# Patient Record
Sex: Female | Born: 1935 | Race: White | Hispanic: No | Marital: Single | Smoking: Never smoker
Health system: Southern US, Community
[De-identification: ages and names within clinical notes are randomized; demographics above are authoritative.]

## PROBLEM LIST (undated history)

## (undated) DIAGNOSIS — G20 Parkinson's disease: Secondary | ICD-10-CM

## (undated) DIAGNOSIS — I10 Essential (primary) hypertension: Secondary | ICD-10-CM

## (undated) DIAGNOSIS — M810 Age-related osteoporosis without current pathological fracture: Secondary | ICD-10-CM

---

## 2006-02-11 ENCOUNTER — Ambulatory Visit: Payer: Self-pay | Admitting: Gastroenterology

## 2006-02-20 ENCOUNTER — Ambulatory Visit: Payer: Self-pay | Admitting: Gastroenterology

## 2006-02-22 ENCOUNTER — Ambulatory Visit: Payer: Self-pay | Admitting: Gastroenterology

## 2006-04-25 ENCOUNTER — Ambulatory Visit: Payer: Self-pay | Admitting: Specialist

## 2006-08-20 ENCOUNTER — Ambulatory Visit: Payer: Self-pay | Admitting: Internal Medicine

## 2006-09-01 ENCOUNTER — Other Ambulatory Visit: Payer: Self-pay

## 2006-09-01 ENCOUNTER — Emergency Department: Payer: Self-pay | Admitting: Emergency Medicine

## 2008-06-26 IMAGING — US US EXTREM LOW VENOUS BILAT
1 series · 17 of 24 positions shown · non-contrast
Comparison: none

REASON FOR EXAM: Bila Edema Eval for DVT Call Report [id number] Ext [phone]
COMMENTS:

PROCEDURE:     US  - US DOPPLER LOW EXTR BILATERAL  - August 20, 2006 [DATE]
RESULT:     The venous flow waveforms bilaterally are normal in appearance.
The femoral and popliteal veins bilaterally show normal compressibility.
Doppler examination shows no deep venous occlusion on either side.

[Series 1: us extrem low venous bilat · 17 of 40 slices shown]
[im 1/40]
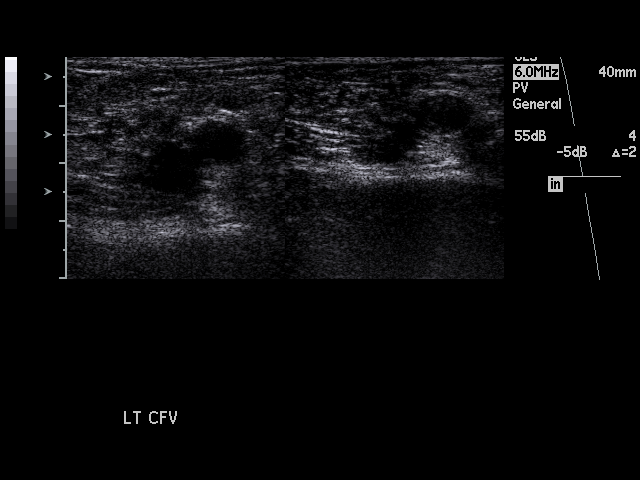
[im 4/40]
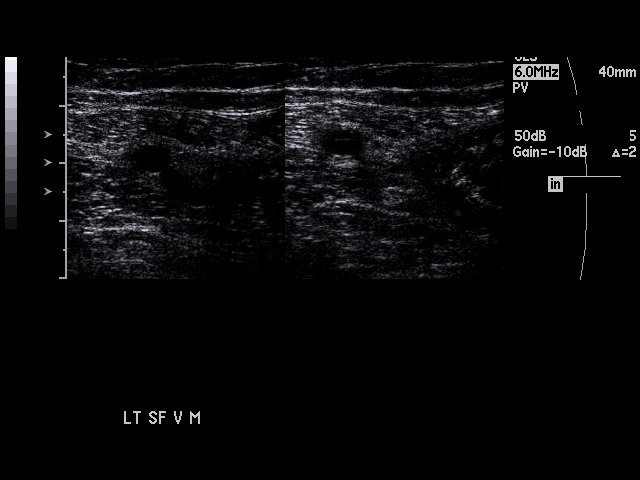
[im 6/40]
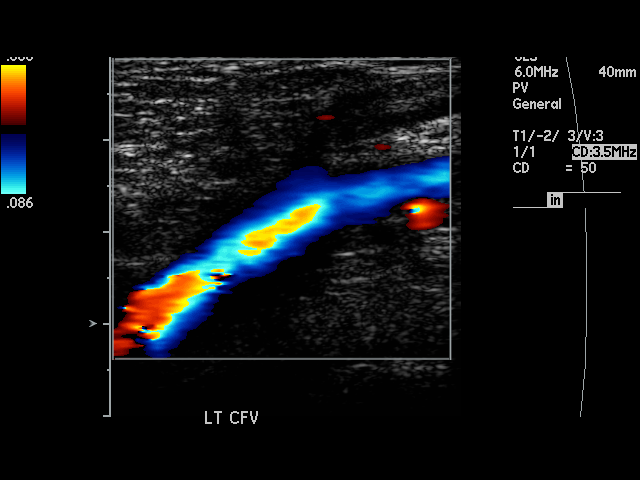
[im 7/40]
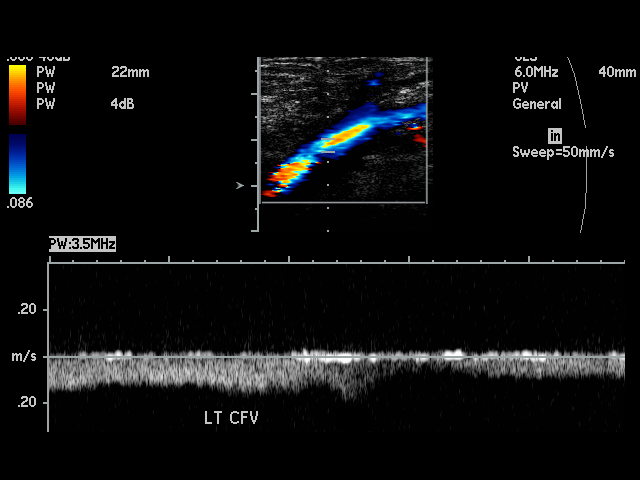
[im 11/40]
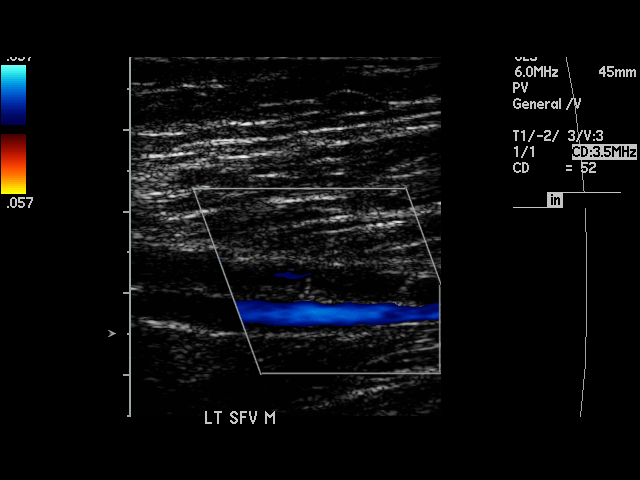
[im 12/40]
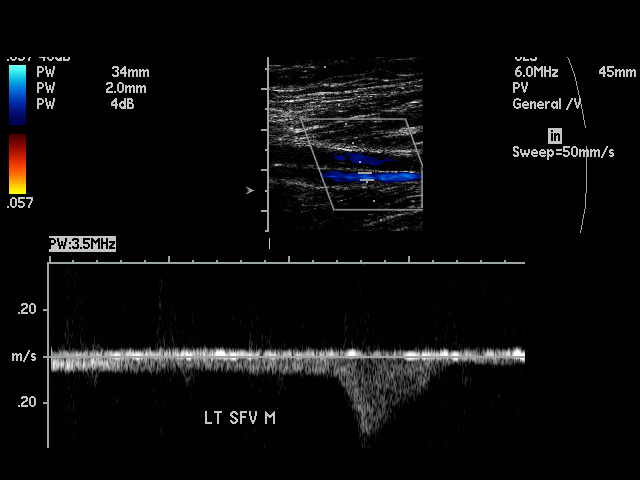
[im 16/40]
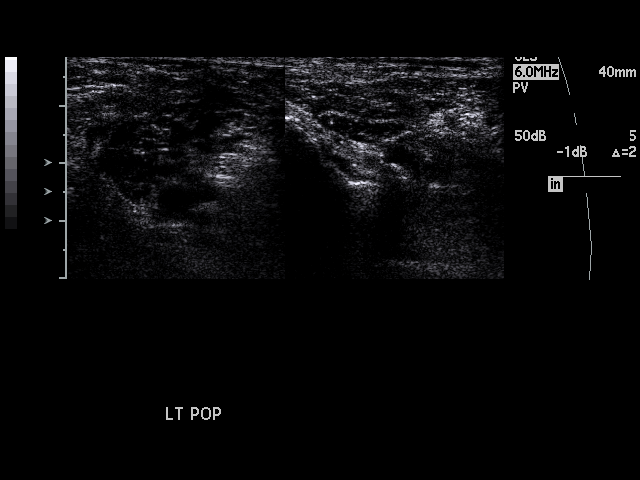
[im 17/40]
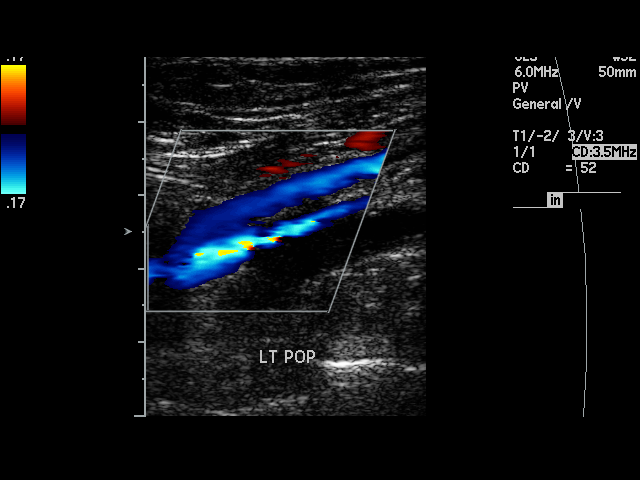
[im 21/40]
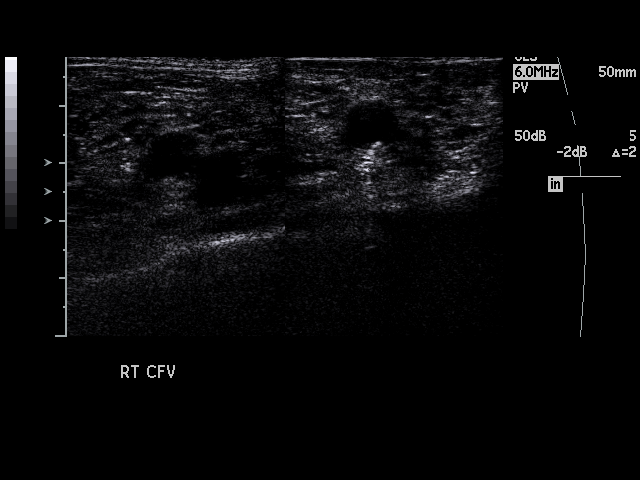
[im 23/40]
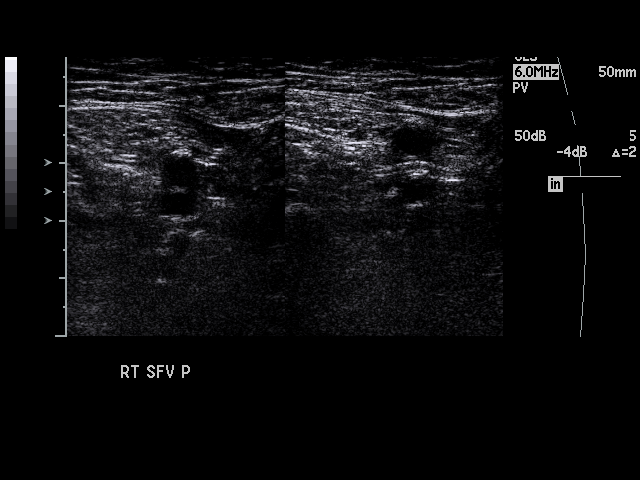
[im 24/40]
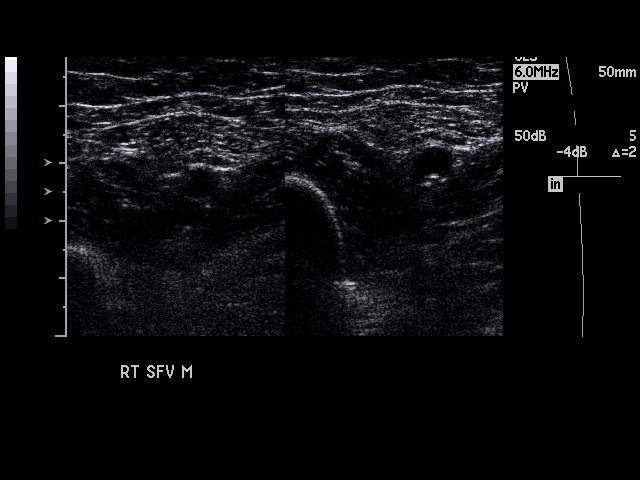
[im 28/40]
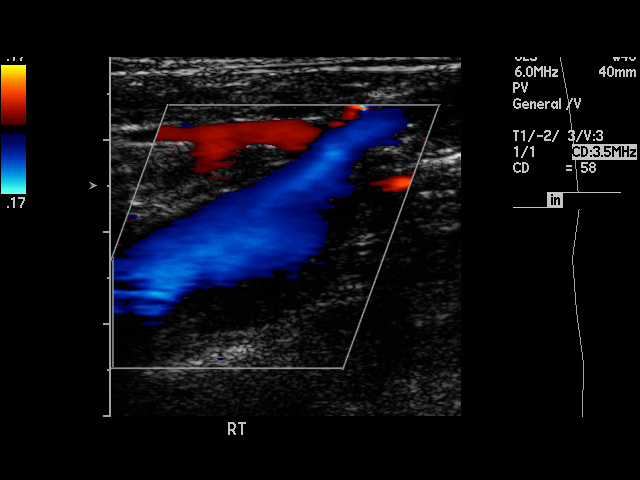
[im 29/40]
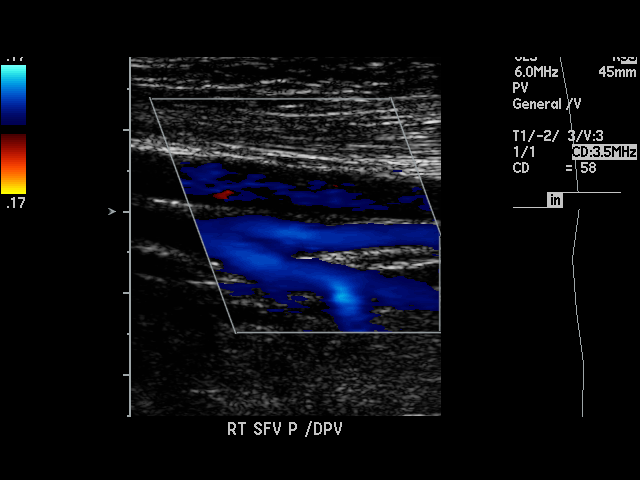
[im 33/40]
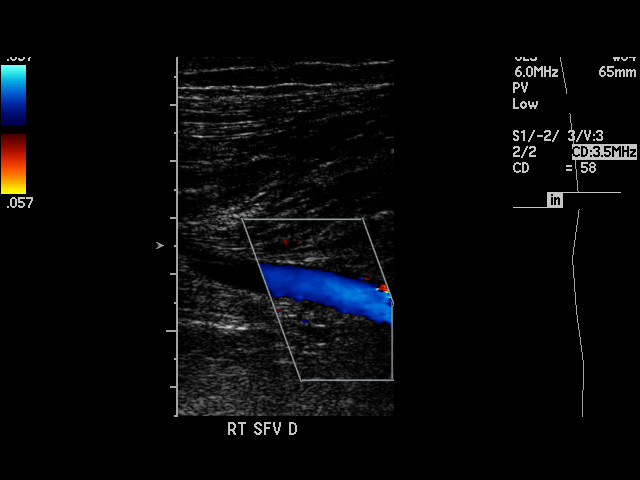
[im 34/40]
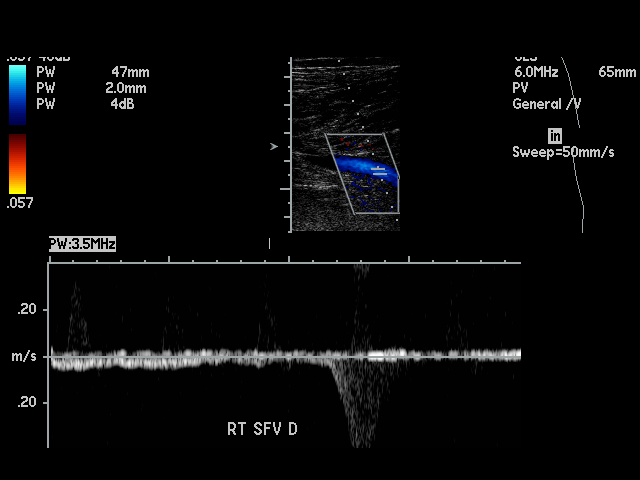
[im 36/40]
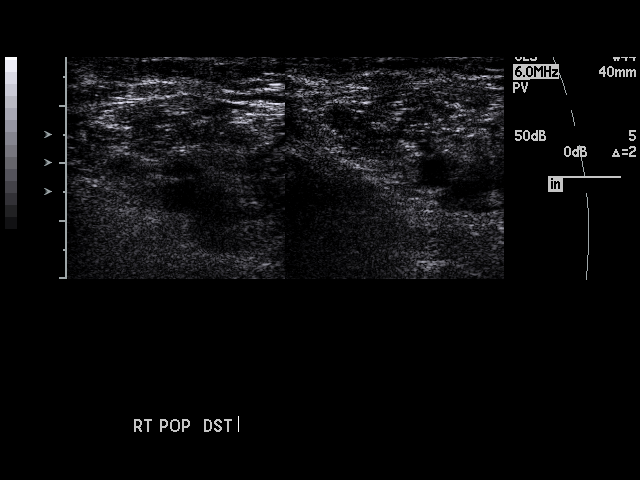
[im 40/40]
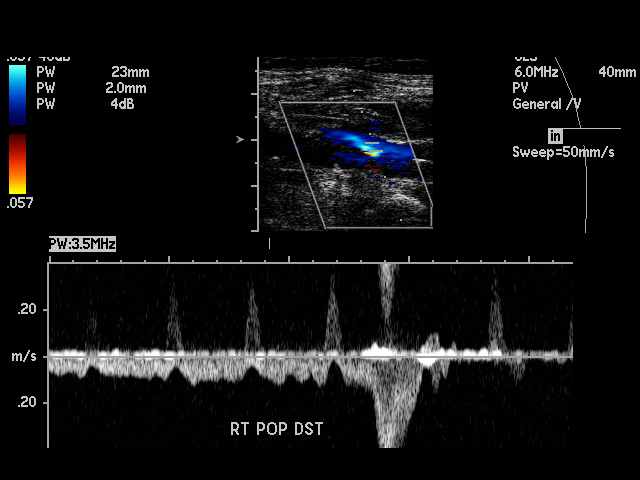

[17 of 24 positions shown; findings below may reference images not displayed]

IMPRESSION: Bilaterally normal study. No deep venous thrombosis is identified on either
side.

## 2012-04-09 DIAGNOSIS — G20 Parkinson's disease: Secondary | ICD-10-CM | POA: Insufficient documentation

## 2012-04-09 DIAGNOSIS — G20A1 Parkinson's disease without dyskinesia, without mention of fluctuations: Secondary | ICD-10-CM | POA: Insufficient documentation

## 2012-04-09 DIAGNOSIS — I10 Essential (primary) hypertension: Secondary | ICD-10-CM | POA: Insufficient documentation

## 2013-08-19 DIAGNOSIS — E785 Hyperlipidemia, unspecified: Secondary | ICD-10-CM | POA: Insufficient documentation

## 2016-10-17 DIAGNOSIS — M8000XD Age-related osteoporosis with current pathological fracture, unspecified site, subsequent encounter for fracture with routine healing: Secondary | ICD-10-CM | POA: Insufficient documentation

## 2021-04-05 DIAGNOSIS — R413 Other amnesia: Secondary | ICD-10-CM | POA: Insufficient documentation

## 2021-04-05 DIAGNOSIS — I959 Hypotension, unspecified: Secondary | ICD-10-CM | POA: Insufficient documentation

## 2021-04-05 DIAGNOSIS — R441 Visual hallucinations: Secondary | ICD-10-CM | POA: Insufficient documentation

## 2021-04-26 DIAGNOSIS — R55 Syncope and collapse: Secondary | ICD-10-CM | POA: Insufficient documentation

## 2021-04-26 DIAGNOSIS — E43 Unspecified severe protein-calorie malnutrition: Secondary | ICD-10-CM | POA: Insufficient documentation

## 2021-04-26 DIAGNOSIS — F4321 Adjustment disorder with depressed mood: Secondary | ICD-10-CM | POA: Insufficient documentation

## 2021-04-26 DIAGNOSIS — R001 Bradycardia, unspecified: Secondary | ICD-10-CM | POA: Insufficient documentation

## 2021-04-26 DIAGNOSIS — R54 Age-related physical debility: Secondary | ICD-10-CM | POA: Insufficient documentation

## 2021-04-28 DIAGNOSIS — R5381 Other malaise: Secondary | ICD-10-CM | POA: Insufficient documentation

## 2021-07-21 ENCOUNTER — Encounter: Payer: Self-pay | Admitting: Emergency Medicine

## 2021-07-21 ENCOUNTER — Emergency Department: Payer: Medicare Other

## 2021-07-21 ENCOUNTER — Other Ambulatory Visit: Payer: Self-pay

## 2021-07-21 DIAGNOSIS — I10 Essential (primary) hypertension: Secondary | ICD-10-CM | POA: Diagnosis not present

## 2021-07-21 DIAGNOSIS — G20 Parkinson's disease: Secondary | ICD-10-CM | POA: Insufficient documentation

## 2021-07-21 DIAGNOSIS — W19XXXA Unspecified fall, initial encounter: Secondary | ICD-10-CM | POA: Diagnosis not present

## 2021-07-21 DIAGNOSIS — Z96649 Presence of unspecified artificial hip joint: Secondary | ICD-10-CM | POA: Diagnosis not present

## 2021-07-21 DIAGNOSIS — N390 Urinary tract infection, site not specified: Secondary | ICD-10-CM | POA: Diagnosis not present

## 2021-07-21 DIAGNOSIS — R42 Dizziness and giddiness: Secondary | ICD-10-CM | POA: Diagnosis present

## 2021-07-21 LAB — BASIC METABOLIC PANEL
Anion gap: 12 (ref 5–15)
BUN: 30 mg/dL — ABNORMAL HIGH (ref 8–23)
CO2: 27 mmol/L (ref 22–32)
Calcium: 9.2 mg/dL (ref 8.9–10.3)
Chloride: 95 mmol/L — ABNORMAL LOW (ref 98–111)
Creatinine, Ser: 0.64 mg/dL (ref 0.44–1.00)
GFR, Estimated: >60 mL/min (ref >60)
Glucose, Bld: 110 mg/dL — ABNORMAL HIGH (ref 70–99)
Potassium: 3.3 mmol/L — ABNORMAL LOW (ref 3.5–5.1)
Sodium: 134 mmol/L — ABNORMAL LOW (ref 135–145)

## 2021-07-21 LAB — CBC
HCT: 37.9 % (ref 36.0–46.0)
Hemoglobin: 12.2 g/dL (ref 12.0–15.0)
MCH: 29.5 pg (ref 26.0–34.0)
MCHC: 32.2 g/dL (ref 30.0–36.0)
MCV: 91.8 fL (ref 80.0–100.0)
Platelets: 642 10*3/uL — ABNORMAL HIGH (ref 150–400)
RBC: 4.13 MIL/uL (ref 3.87–5.11)
RDW: 13.2 % (ref 11.5–15.5)
WBC: 13.1 10*3/uL — ABNORMAL HIGH (ref 4.0–10.5)
nRBC: 0 % (ref 0.0–0.2)

## 2021-07-21 NOTE — ED Triage Notes (Signed)
Pt via EMS from [facility]. Pt had a fall trying to get to the bathroom pt states she was dizzy and fell. Pt did hit her head. But unknown LOC. Pt c/o headache. Pt is receiving Lovenox Denies any other pain. Pt is A&OX4 and NAD.   Pt has a hx of Parkinson.

## 2021-07-21 NOTE — ED Triage Notes (Signed)
Pt comes into the ED via [facility] from [facility] c/o unwitnessed mechanical fall.  PT also has hematoma present on the posterior side of the head.  Unknown LOC.  Pt c/o head pain and dizziness.  Facility unsure of mental status at baseline due to patient only having gotten there 2 days ago.  Pt currently on Lovenox.  H/o Parkinsons.   159/85 97% Ra 78 HR 98.1 oral 120 CBG

## 2021-07-22 ENCOUNTER — Emergency Department
Admission: EM | Admit: 2021-07-22 | Discharge: 2021-07-22 | Disposition: A | Discharge status: Skilled Nursing Facility | Payer: Medicare Other | Attending: Emergency Medicine | Admitting: Emergency Medicine

## 2021-07-22 ENCOUNTER — Emergency Department: Payer: Medicare Other

## 2021-07-22 ENCOUNTER — Other Ambulatory Visit: Payer: Self-pay

## 2021-07-22 DIAGNOSIS — N390 Urinary tract infection, site not specified: Secondary | ICD-10-CM

## 2021-07-22 DIAGNOSIS — W19XXXA Unspecified fall, initial encounter: Secondary | ICD-10-CM

## 2021-07-22 HISTORY — DX: Parkinson's disease: G20

## 2021-07-22 HISTORY — DX: Age-related osteoporosis without current pathological fracture: M81.0

## 2021-07-22 HISTORY — DX: Essential (primary) hypertension: I10

## 2021-07-22 LAB — URINALYSIS, ROUTINE W REFLEX MICROSCOPIC
Glucose, UA: NEGATIVE mg/dL
Hgb urine dipstick: NEGATIVE
Ketones, ur: 15 mg/dL — AB
Nitrite: NEGATIVE
Protein, ur: 30 mg/dL — AB
Specific Gravity, Urine: 1.02 (ref 1.005–1.030)
pH: 5.5 (ref 5.0–8.0)

## 2021-07-22 LAB — URINALYSIS, MICROSCOPIC (REFLEX): WBC, UA: >50 WBC/hpf (ref 0–5)

## 2021-07-22 LAB — TROPONIN I (HIGH SENSITIVITY): Troponin I (High Sensitivity): 10 ng/L (ref <18)

## 2021-07-22 MED ORDER — CEPHALEXIN 500 MG PO CAPS: 500.0000 mg | ORAL_CAPSULE | Freq: Four times a day (QID) | ORAL | 0 refills | Status: AC

## 2021-07-22 MED ORDER — CEPHALEXIN 500 MG PO CAPS
500.0000 mg | ORAL_CAPSULE | Freq: Once | ORAL | Status: AC
Start: 2021-07-22 — End: 2021-07-22
  Administered 2021-07-22: 500 mg via ORAL
  Filled 2021-07-22: qty 1

## 2021-07-22 NOTE — Discharge Instructions (Addendum)
Your CAT scan of your head and neck did not show any injury to your brain or spine.  Your blood work was also all reassuring.  The urine sample shows that you may have a urinary tract infection so we are starting you on an antibiotic which she should take 4 times a day the next 7 days.  Reasons to return to the emergency department would be a change in mental status, inability to eat or drink or fevers.

## 2021-07-22 NOTE — ED Notes (Signed)
Pt transported to CT ?

## 2021-07-22 NOTE — ED Notes (Signed)
Reported called to [facility]. Family has chosen to transport pt back to facility by POV.

## 2021-07-22 NOTE — ED Provider Notes (Signed)
[facility] Provider Note    Event Date/Time   First MD Initiated Contact with Patient 07/22/21 [phone]     (approximate)   History   Dizziness and Fall   HPI  Ana Merritt is a 88 y.o. female with past medical history of Parkinson's disease, hypertension osteoporosis and recent hip replacement who presents after a fall.  Patient is accompanied by her daughter who notes that she has been at [facility] since having a hip replacement done several weeks ago.  Since being in [facility] she has functionally declined from the standpoint of her Parkinson's.  She has not been eating as much and has overall been weaker.  She has been more unsteady on her feet understandably after her hip surgery.  Today was given a laxative for constipation and then use the call bell because she felt she had to go to the bathroom but then got up on her own which she is not supposed to do.  She then had a fall.  Was not witnessed.  Patient had complained earlier in the day of some dizziness.  They do note that her blood pressure is often low due to autonomic instability from her Parkinson's disease.  Otherwise she has not had fever vomiting shortness of breath or complaint of any acute pain other than on her head after the fall.  She is not on blood thinners.    Past Medical History:  Diagnosis Date   Hypertension    Osteoporosis    Parkinson disease ([place])     There are no problems to display for this patient.    Physical Exam  Triage Vital Signs: ED Triage Vitals  Enc Vitals Group     BP 07/21/21 1852 (!) 165/87     Pulse Rate 07/21/21 1852 76     Resp 07/21/21 1852 18     Temp 07/21/21 1852 98.4 F (36.9 C)     Temp Source 07/21/21 1852 Oral     SpO2 07/21/21 1852 99 %     Weight 07/21/21 1853 80 lb (36.3 kg)     Height 07/21/21 1853 4\' 11"  (1.499 m)     Head Circumference --      [facility] Flow --      Pain Score 07/21/21 1853 10     Pain Loc --      Pain  Edu? --      Excl. in [place]? --     Most recent vital signs: Vitals:   07/22/21 0300 07/22/21 0415  BP: (!) 145/73 139/76  Pulse: 74 76  Resp: 17 13  Temp:  98.2 F (36.8 C)  SpO2: 96% 96%     General: Resting comfortably, somewhat fatigued but does open her eyes and follow commands appropriately CV:  Good peripheral perfusion.  Resp:  Normal effort.  Abd:  No distention.  Abdomen is soft and nontender Neuro:             Patient is sleeping, does not want to be woken up, does open her eyes and follow commands appropriately Aox3, nml speech  PERRL, EOMI, face symmetric, nml tongue movement  5/5 strength in the BL upper and lower extremities  Sensation grossly intact in the BL upper and lower extremities  Finger-nose-finger intact BL  Other:  She has no focal tenderness of the C-spine, chest wall, pelvis, bilateral upper or lower extremities she is able to range the hips and the knees without discomfort bilaterally  There is a area of swelling on the right posterior occiput with an abrasion but no laceration   ED Results / Procedures / Treatments  Labs (all labs ordered are listed, but only abnormal results are displayed) Labs Reviewed  BASIC METABOLIC PANEL - Abnormal; Notable for the following components:      Result Value   Sodium 134 (*)    Potassium 3.3 (*)    Chloride 95 (*)    Glucose, Bld 110 (*)    BUN 30 (*)    All other components within normal limits  CBC - Abnormal; Notable for the following components:   WBC 13.1 (*)    Platelets 642 (*)    All other components within normal limits  URINALYSIS, ROUTINE W REFLEX MICROSCOPIC - Abnormal; Notable for the following components:   APPearance HAZY (*)    Bilirubin Urine SMALL (*)    Ketones, ur 15 (*)    Protein, ur 30 (*)    Leukocytes,Ua SMALL (*)    All other components within normal limits  URINALYSIS, MICROSCOPIC (REFLEX) - Abnormal; Notable for the following components:   Bacteria, UA RARE (*)    Non  Squamous Epithelial PRESENT (*)    All other components within normal limits  URINE CULTURE  CBG MONITORING, ED  TROPONIN I (HIGH SENSITIVITY)     EKG  EKG interpretation performed by myself: NSR, nml axis, nml intervals, no acute ischemic changes    RADIOLOGY I reviewed the CT scan of the brain which does not show any acute intracranial process; agree with radiology report   I reviewed the CT of the cervical spine which does not show any acute fracture or misalignment; agree with radiology report     PROCEDURES:  Critical Care performed: No  Procedures  The patient is on the cardiac monitor to evaluate for evidence of arrhythmia and/or significant heart rate changes.   MEDICATIONS ORDERED IN ED: Medications  cephALEXin (KEFLEX) capsule 500 mg (500 mg Oral Given 07/22/21 0401)     IMPRESSION / MDM / ASSESSMENT AND PLAN / ED COURSE  I reviewed the triage vital signs and the nursing notes.                              Differential diagnosis includes, but is not limited to, dehydration, deconditioning, mechanical fall  Patient is an 88 year old female with a past medical history of Parkinson's disease and a recent hip replacement presents after a fall.  She is currently at a rehab facility after her hip replacement and is not supposed be ambulating on her own got up from bed to use the bathroom after being given a laxative and had a fall.  Patient had also complained of some intermittent dizziness during the day to her family members.  Fall was unwitnessed she does have a area of swelling in the posterior occiput.  CT head and C-spine are negative for acute injury.  On the rest of the patient's exam she has no focal tenderness of her chest abdomen upper and lower extremities and able to range the hips without difficulty.  Patient's daughter notes that she has had somewhat of a subacute decline since being hospitalized for her hip surgery and has just been having difficulty  eating and having low energy.  Has been working with speech, occupational therapy and physical therapy at [facility].  Today seem to be somewhat more fatigued.  Vital signs are within  normal limits.  On exam patient is very tired but is able to follow commands and she has a nonfocal neurologic exam she has no abdominal tenderness.  I reviewed her blood work which overall was reassuring, BUN mildly elevated indicating possible dehydration.  Also has a mild leukocytosis to 13.  Her UA does have significant WBCs, patient not complaining of dysuria but in the setting of being elderly with fatigue and the leukocytosis we will treat with a 7-day course of Keflex.  We will send a urine culture.  Given she has normal vital signs and is close to her baseline and has a safe place where she will be monitored I think she is appropriate for discharge.         FINAL CLINICAL IMPRESSION(S) / ED DIAGNOSES   Final diagnoses:  Fall, initial encounter  Urinary tract infection without hematuria, site unspecified     Rx / DC Orders   ED Discharge Orders          Ordered    cephALEXin (KEFLEX) 500 MG capsule  4 times daily        07/22/21 0411             Note:  This document was prepared using Dragon voice recognition software and may include unintentional dictation errors.   Rada Hay, MD 07/22/21 [phone]

## 2021-07-22 NOTE — ED Notes (Signed)
Pt assisted to the bathroom. Pt able to urinate and have BM at this time. Pt assisted back in the wc and wheeled back out to the lobby.

## 2021-07-25 LAB — URINE CULTURE: Culture: >=100000 — AB

## 2021-07-26 ENCOUNTER — Other Ambulatory Visit: Payer: Self-pay

## 2021-07-26 ENCOUNTER — Non-Acute Institutional Stay: Payer: Medicare Other | Admitting: Primary Care

## 2021-07-26 VITALS — Ht 59.0 in | Wt 81.0 lb

## 2021-07-26 DIAGNOSIS — G20 Parkinson's disease: Secondary | ICD-10-CM

## 2021-07-26 DIAGNOSIS — R413 Other amnesia: Secondary | ICD-10-CM

## 2021-07-26 DIAGNOSIS — G20A1 Parkinson's disease without dyskinesia, without mention of fluctuations: Secondary | ICD-10-CM

## 2021-07-26 DIAGNOSIS — Z515 Encounter for palliative care: Secondary | ICD-10-CM

## 2021-07-26 DIAGNOSIS — E43 Unspecified severe protein-calorie malnutrition: Secondary | ICD-10-CM

## 2021-07-26 DIAGNOSIS — R54 Age-related physical debility: Secondary | ICD-10-CM

## 2021-07-26 NOTE — Progress Notes (Signed)
ED Antimicrobial Stewardship Positive Culture Follow Up   Ana Merritt is an 88 y.o. female who presented to [facility] on 07/22/2021 with a chief complaint of  Chief Complaint  Patient presents with   Dizziness   Fall    Recent Results (from the past 720 hour(s))  Urine Culture     Status: Abnormal   Collection Time: 07/22/21  1:13 AM   Specimen: Urine, Random  Result Value Ref Range Status   Specimen Description   Final    URINE, RANDOM Performed at [facility], [address] [place], [place] [address]    Special Requests   Final    NONE Performed at [facility], [address] [place], [place] [address]    Culture >=100,000 COLONIES/mL ENTEROCOCCUS FAECALIS (A)  Final   Report Status 07/25/2021 FINAL  Final   Organism ID, Bacteria ENTEROCOCCUS FAECALIS (A)  Final      Susceptibility   Enterococcus faecalis - MIC*    AMPICILLIN <=2 SENSITIVE Sensitive     NITROFURANTOIN <=16 SENSITIVE Sensitive     VANCOMYCIN 1 SENSITIVE Sensitive     * >=100,000 COLONIES/mL ENTEROCOCCUS FAECALIS    [x]  Treated with Cephalexin, organism resistant to prescribed antimicrobial []  Patient discharged originally without antimicrobial agent and treatment is now indicated  New antibiotic prescription: Patient discharged back to SNF. After discussion with ED provider called SNF to communicate culture results. Faxed over culture results to SNF following conversation with RN. Recommendations written on fax : amoxicillin 500 mg BID x 5 days  ED Provider: Dr. , PharmD, BCPS Clinical Pharmacist   07/26/2021, 4:12 PM Clinical Pharmacist Monday - Friday phone -  [phone] Saturday - Sunday phone - [phone]

## 2021-07-26 NOTE — Progress Notes (Signed)
[facility] Consult Note Telephone: [phone]  Fax: [phone]   Date of encounter: 07/26/21 1:23 PM PATIENT NAME: Ana Merritt [address] [place] [address]   [phone] (home)  DOB: 1931-02-22 MRN: [id number] PRIMARY CARE PROVIDER:    [facility] [address] [place],  [place] [address] [phone]   REFERRING PROVIDER:   Rica Koyanagi, MD [address] [place],  [place] [address] [phone]   RESPONSIBLE PARTY:    Contact Information     Name Relation Home Work [place]  [phone]         I met face to face with patient and family in  [facility] facility. Palliative Care was asked to follow this patient by consultation request of  Rica Koyanagi, MD   to address advance care planning and complex medical decision making. This is the initial visit.                                     ASSESSMENT AND PLAN / RECOMMENDATIONS:   Advance Care Planning/Goals of Care: Goals include to maximize quality of life and symptom management. Patient/health care surrogate gave his/her permission to discuss.Our advance care planning conversation included a discussion about:    The value and importance of advance care planning  Experiences with loved ones who have been seriously ill or have died  Exploration of personal, cultural or spiritual beliefs that might influence medical decisions  Exploration of goals of care in the event of a sudden injury or illness  Identification of a healthcare agent - Daughters Review of an  advance directive document , see below.  Discussed goals of care with daughter CODE STATUS: DNR  I reviewed a MOST form today. The patient and family outlined their wishes for the following treatment decisions:  Cardiopulmonary Resuscitation: Do Not Attempt Resuscitation (DNR/No CPR)  Medical Interventions: Comfort Measures: Keep clean, warm, and dry. Use medication by any  route, positioning, wound care, and other measures to relieve pain and suffering. Use oxygen, suction and manual treatment of airway obstruction as needed for comfort. Do not transfer to the hospital unless comfort needs cannot be met in current location.  Antibiotics: Determine use of limitation of antibiotics when infection occurs  IV Fluids: IV fluids if indicated  Feeding Tube: No feeding tube    Symptom Management/Plan:   I met with patient in her nursing home room with her daughter present. This family is known to me from my caring for their husband/father. Patient and daughter recount that patient has had some decline in health with some falls. She has had recent fractures  and immobility related to her parkinsons disease. She is presently at [facility] for rehab with assessment of whether she can return home or will remain in long-term care.   Patient was alert and oriented x 3,  able to answer questions but appears very frail. Daughter endorses she's not eating as well as she has in the past  and we discussed Remeron but apparently she failed this at a previous time. Facility Np will begin 25 mg of Zoloft increasing to 50 mg after two weeks for mood elevation and hopefully appetite improvement. I will continue to follow.   Follow up Palliative Care Visit: Palliative care will continue to follow for complex medical decision making, advance care planning, and clarification of goals. Return 2-4  weeks or prn.  I spent 35 minutes providing this consultation. More than 50% of the time in this consultation was spent in counseling and care coordination.  PPS: 30%  HOSPICE ELIGIBILITY/DIAGNOSIS: TBD  Chief Complaint: debility, immobility from PD  HISTORY OF PRESENT ILLNESS:  Ana Merritt is a 88 y.o. year old female  with debility, immobility, PD, recent falls and multiple fractures, anorexia, decline. Presents today in SNF for palliative assessment .   History obtained from review of EMR,  discussion with primary team, and interview with family, facility staff/caregiver and/or Ms. Ana Merritt.  I reviewed available labs, medications, imaging, studies and related documents from the EMR.  Records reviewed and summarized above.   ROS   General: NAD ENMT: denies dysphagia Pulmonary: denies cough, denies increased SOB Abdomen: endorses  fair appetite, denies constipation, endorses continence of bowel GU: denies dysuria, endorses continence of urine MSK:  endorses increased weakness,  no falls reported at SNF Skin: denies rashes or wounds Neurological: denies pain, denies insomnia Psych: Endorses flat  mood Heme/lymph/immuno: denies bruises, abnormal bleeding  Physical Exam: Current and past weights:81 lbs,  prior 84 lbs. Body mass index is 16.36 kg/m. Constitutional: NAD General: frail appearing, thin EYES: anicteric sclera, lids intact, no discharge  ENMT: intact hearing, oral mucous membranes moist CV: S1S2, RRR, no LE edema Pulmonary: LCTA, no increased work of breathing, no cough, room air Abdomen: intake 50%, no ascites GU: deferred MSK: advanced  sarcopenia, moves all extremities, non ambulatory Skin: warm and dry, no rashes or wounds on visible skin Neuro:  + generalized weakness,  +cognitive impairment Psych: slight anxious affect, A and O x 2 Hem/lymph/immuno: no widespread bruising CURRENT PROBLEM LIST:  Patient Active Problem List   Diagnosis Date Noted   Physical deconditioning 04/28/2021   Bradycardia 04/26/2021   Frailty 04/26/2021   Grief 04/26/2021   Severe protein-calorie malnutrition ([place]) 04/26/2021   Syncope 04/26/2021   Hypotension 04/05/2021   Loss of memory 04/05/2021   Visual hallucinations 04/05/2021   Age-related osteoporosis with current pathological fracture with routine healing 10/17/2016   Hyperlipidemia 08/19/2013   Hypertension, benign 04/09/2012   Parkinson disease ([place]) 04/09/2012     PAST MEDICAL HISTORY:  Active Ambulatory  Problems    Diagnosis Date Noted   Age-related osteoporosis with current pathological fracture with routine healing 10/17/2016   Bradycardia 04/26/2021   Frailty 04/26/2021   Grief 04/26/2021   Hyperlipidemia 08/19/2013   Hypertension, benign 04/09/2012   Hypotension 04/05/2021   Parkinson disease ([place]) 04/09/2012   Loss of memory 04/05/2021   Physical deconditioning 04/28/2021   Severe protein-calorie malnutrition ([place]) 04/26/2021   Syncope 04/26/2021   Visual hallucinations 04/05/2021   Resolved Ambulatory Problems    Diagnosis Date Noted   No Resolved Ambulatory Problems   Past Medical History:  Diagnosis Date   Hypertension    Osteoporosis     SOCIAL HX:  Social History   Tobacco Use   Smoking status: Never   Smokeless tobacco: Never  Substance Use Topics   Alcohol use: Not on file   FAMILY HX: not on file   ALLERGIES: No Known Allergies   PERTINENT MEDICATIONS:  Outpatient Encounter Medications as of 07/26/2021  Medication Sig   acetaminophen (TYLENOL) 500 MG tablet Take 1 tablet by mouth every 6 (six) hours as needed.   amLODipine (NORVASC) 5 MG tablet Take 5 mg by mouth 2 (two) times daily.   Carbidopa-Levodopa ER 23.75-95 MG CPCR Take 2 capsules by mouth in the morning,  at noon, in the evening, and at bedtime.   cephALEXin (KEFLEX) 500 MG capsule Take 1 capsule (500 mg total) by mouth 4 (four) times daily for 7 days.   Cholecalciferol 50 MCG (2000 UT) TABS Take 1 tablet by mouth daily.   enalapril (VASOTEC) 20 MG tablet Take 20 mg by mouth 2 (two) times daily.   enoxaparin (LOVENOX) 30 MG/0.3ML injection Inject 30 mg into the skin daily.   No facility-administered encounter medications on file as of 07/26/2021.     Thank you for the opportunity to participate in the care of Ms. Ana Merritt.  The palliative care team will continue to follow. Please call our office at [phone] if we can be of additional assistance.   Jason Coop, NP , DNP,  AGPCNP-BC  COVID-19 PATIENT SCREENING TOOL Asked and negative response unless otherwise noted:  Have you had symptoms of covid, tested positive or been in contact with someone with symptoms/positive test in the past 5-10 days?

## 2021-08-14 ENCOUNTER — Telehealth: Payer: Self-pay

## 2021-08-14 NOTE — Telephone Encounter (Signed)
Attempted to contact patient's POA Darel Hong to schedule a Palliative Care consult appointment. No answer left a message to return call.

## 2021-08-15 ENCOUNTER — Telehealth: Payer: Self-pay

## 2021-08-15 NOTE — Telephone Encounter (Signed)
Spoke with patient's daughter Darel Hong and scheduled a Virtual (Facetime) Palliative Consult for 08/21/21 @ 3:30 PM.   Consent obtained; updated Outlook/Netsmart/Team List and Epic.

## 2021-08-15 NOTE — Telephone Encounter (Signed)
Attempted to contact patient's POA Darel Hong to schedule a Palliative Care consult appointment. No answer left a message to return call on both home and mobile.

## 2021-08-21 ENCOUNTER — Other Ambulatory Visit: Payer: Self-pay

## 2021-08-21 ENCOUNTER — Other Ambulatory Visit: Payer: Medicare Other | Admitting: Student

## 2021-08-21 DIAGNOSIS — Z515 Encounter for palliative care: Secondary | ICD-10-CM

## 2021-08-21 DIAGNOSIS — R531 Weakness: Secondary | ICD-10-CM

## 2021-08-21 DIAGNOSIS — E43 Unspecified severe protein-calorie malnutrition: Secondary | ICD-10-CM

## 2021-08-21 DIAGNOSIS — G20 Parkinson's disease: Secondary | ICD-10-CM

## 2021-08-21 NOTE — Progress Notes (Signed)
Therapist, nutritional Palliative Care Consult Note Telephone: [phone]  Fax: [phone]    Date of encounter: 08/21/21 3:41 PM PATIENT NAME: Ana Merritt [address] [place] [address]   [phone] (home)  DOB: 1930-10-19 MRN: [id number] PRIMARY CARE PROVIDER:    [facility], [address],  [address] [place] [address] [phone]  REFERRING PROVIDER:   [facility] [address] [place],  [place] [address] [phone]  RESPONSIBLE PARTY:    Contact Information     Name Relation Home Work Mobile   Mikki Harbor  [phone]  [phone]       Due to the COVID-19 crisis, this visit was done via telemedicine from my office and it was initiated and consent by this patient and or family.  I connected with  Meghin Thivierge OR PROXY on 08/21/21 by a video enabled telemedicine application and verified that I am speaking with the correct person using two identifiers.   I discussed the limitations of evaluation and management by telemedicine. The patient expressed understanding and agreed to proceed.                                   ASSESSMENT AND PLAN / RECOMMENDATIONS:   Advance Care Planning/Goals of Care: Goals include to maximize quality of life and symptom management. Patient/health care surrogate gave his/her permission to discuss. Our advance care planning conversation included a discussion about:    The value and importance of advance care planning  Experiences with loved ones who have been seriously ill or have died  Exploration of personal, cultural or spiritual beliefs that might influence medical decisions  Exploration of goals of care in the event of a sudden injury or illness  CODE STATUS: DNR  Family would like for patient to remain in the home with support. Education provided on palliative medicine vs. Hospice services. Palliative medicine will continue to provide ongoing support and symptom  management.   Symptom Management/Plan:  Parkinson's disease- patient to have her Rytary increased to 2 tablets every 3 hours starting 08/23/2021. Continue Zoloft 50 mg daily. Monitor for effectiveness, worsening symptoms. Family to assist with adl support. Has hospital bed, [facility]; no other DME needs identified at this time. Monitor for further cognitive and functional loss. Follow up with Neurology as scheduled.   Generalized weakness-encourage use of walker and gait belt for ambulation; monitor for falls.   Protein calorie malnutrition-patient's appetite has improved since returning home. Weight has increased to 82.5 pounds. Encourage foods patient enjoys; nutritional supplement.   Follow up Palliative Care Visit: Palliative care will continue to follow for complex medical decision making, advance care planning, and clarification of goals. Return in 8 weeks or prn.  This visit was coded based on medical decision making (MDM).  PPS: 40%  HOSPICE ELIGIBILITY/DIAGNOSIS: TBD  Chief Complaint: Palliative Medicine follow up visit.   HISTORY OF PRESENT ILLNESS:  Ana Merritt is a 88 y.o. year old female  with Parkinson's disease, memory loss, visual hallucinations, hypertension, hyperlipidemia, hypotension, severe protein calorie malnutrition, osteoporosis.  Patient currently resides at home with family support; recently discharged from SNF. Family alternates, providing around the clock support. Patient was seen by PCP on Friday. Daughter thought she may have had a UTI; results were negative. Denies any urinary complaints at this time. She has a hx of UTI's. Patient did have a fall on Saturday; sustained skin tear  to hand; otherwise no apparent injury. Usually uses walker for ambulation, but sometimes forgets. Her sertraline was increased to 50 mg. Her Rytary was also increased; she will start increased dose on 08/23/21 as family did not want to make too many changes at one time. Daughter reports no  worsening symptoms or dyskinesia. Patient's appetite has improved since returning home; weight is up to 82.5 pounds. She is sleeping well at night. Patient's blood pressures have been labile; she is currently being followed by a CCM and pharmacist to address her blood pressure medications in attempt to manage her blood pressure better per daughter. A 10-point ROS is negative, except for the pertinent positives and negatives detailed per the HPI.   History obtained from review of EMR, discussion with primary team, and interview with family, facility staff/caregiver and/or Ms. Greig Castilla.  I reviewed available labs, medications, imaging, studies and related documents from the EMR.  Records reviewed and summarized above.    Physical Exam:  Constitutional: NAD General: frail appearing, thin EYES: anicteric sclera, lids intact, no discharge  ENMT: intact hearing, oral mucous membranes moist, dentition intact Pulmonary: no increased work of breathing, no cough, room air GU: deferred MSK: + sarcopenia, moves all extremities, ambulatory Skin: warm and dry, no rashes or wounds on visible skin Neuro: generalized weakness Psych: non-anxious affect, A and O x 3 Hem/lymph/immuno: no widespread bruising   Thank you for the opportunity to participate in the care of Ms. Greig Castilla.  The palliative care team will continue to follow. Please call our office at [phone] if we can be of additional assistance.   Luella Cook, NP   COVID-19 PATIENT SCREENING TOOL Asked and negative response unless otherwise noted:   Have you had symptoms of covid, tested positive or been in contact with someone with symptoms/positive test in the past 5-10 days? No

## 2021-09-20 ENCOUNTER — Other Ambulatory Visit: Payer: Self-pay

## 2021-09-20 ENCOUNTER — Other Ambulatory Visit: Payer: Medicare Other | Admitting: Student

## 2021-09-20 DIAGNOSIS — E43 Unspecified severe protein-calorie malnutrition: Secondary | ICD-10-CM

## 2021-09-20 DIAGNOSIS — I10 Essential (primary) hypertension: Secondary | ICD-10-CM

## 2021-09-20 DIAGNOSIS — G20 Parkinson's disease: Secondary | ICD-10-CM

## 2021-09-20 DIAGNOSIS — Z515 Encounter for palliative care: Secondary | ICD-10-CM

## 2021-09-20 NOTE — Progress Notes (Signed)
? ? ?Manufacturing engineer ?Community Palliative Care Consult Note ?Telephone: [phone]  ?Fax: [phone]  ? ? ?Date of encounter: 09/20/21 ?9:51 AM ?PATIENT NAME: Ana Merritt ?[place] [address]   ?[phone] (home)  ?DOB: May 03, 1931 ?MRN: [id number] ?PRIMARY CARE PROVIDER:    ?[place],  ?[place] [address] ?[phone] ? ?REFERRING PROVIDER:   ?[facility] ?[place],  [place] [address] ?[phone] ? ?RESPONSIBLE PARTY:    ?Contact Information   ? ? Name Relation Home Work Mobile  ? Quillian Quince  [phone]  [phone]  ? ?  ? ? ? ?I met face to face with patient and family in the home. Palliative Care was asked to follow this patient by consultation request of  [place] to address advance care planning and complex medical decision making. This is a follow up visit. ? ?                                 ASSESSMENT AND PLAN / RECOMMENDATIONS:  ? ?Advance Care Planning/Goals of Care: Goals include to maximize quality of life and symptom management. Patient/health care surrogate gave his/her permission to discuss. ?Our advance care planning conversation included a discussion about:    ?The value and importance of advance care planning  ?Experiences with loved ones who have been seriously ill or have died  ?Exploration of personal, cultural or spiritual beliefs that might influence medical decisions  ?Exploration of goals of care in the event of a sudden injury or illness  ?CODE STATUS:  DNR ? ?Education provided on palliative medicine. Will continue to provide supportive care and symptom management.  ? ?Symptom Management/Plan: ? ? ?Parkinson's disease- Continue Rytary 2 tablets every 3 hours starting 08/23/2021. Continue Zoloft 50 mg daily. Monitor for effectiveness, worsening symptoms. Family to assist with adl support. Encourage walker and gait belt for ambulation. Monitor for further cognitive and  functional loss. Follow up with Neurology as scheduled. ? ?Protein calorie malnutrition-patient's appetite has improved since returning home. Encourage foods patient enjoys; nutritional supplement. Monitor for weight loss.  ? ?Hypertension-patient with labile blood pressures. Continue losartan 50 mg daily, amlodipine 2.5 mg QHS. Family to check blood pressures in the home.  ? ?Follow up Palliative Care Visit: Palliative care will continue to follow for complex medical decision making, advance care planning, and clarification of goals. Return in 8 weeks or prn. ? ? ?This visit was coded based on medical decision making (MDM). ? ?PPS: 40% ? ?HOSPICE ELIGIBILITY/DIAGNOSIS: TBD ? ?Chief Complaint: Palliative Medicine follow up visit.  ? ?HISTORY OF PRESENT ILLNESS:  Ana Merritt is a 88 y.o. year old female  with Parkinson's disease, memory loss, visual hallucinations, hypertension, hyperlipidemia, hypotension, severe protein calorie malnutrition, osteoporosis.  ? ?Patient resides at home with family support. Patient denies having pain, shortness of breath. She is moving bowels okay; no nausea. No voiding difficulty; no urinary symptoms expressed. Uses gait belt and walker for ambulation. No recent falls. Appetite improved since returning home; weight has been stable. F/u appointment on 10/10/21 with PCP. Blood pressures continue to be labile; daughter checking in the home.  A 10-point ROS is negative, except for the pertinent positives and negatives detailed per the HPI.  ? ?History obtained from review of EMR, discussion with primary team, and interview with family, facility staff/caregiver and/or Ana Merritt.  ?I reviewed available labs, medications,  imaging, studies and related documents from the EMR.  Records reviewed and summarized above.  ? ?Physical Exam: ?Pulse 56, resp 16, b/p 156/80, sats 96% on room air. ?Constitutional: NAD ?General: frail appearing, thin ?EYES: anicteric sclera, lids intact, no discharge   ?ENMT: intact hearing, oral mucous membranes moist, dentition intact ?CV: S1S2, RRR, no LE edema ?Pulmonary: LCTA, no increased work of breathing, no cough, room air ?Abdomen: normo-active BS + 4 quadrants, soft and non tender, no ascites ?GU: deferred ?MSK: sarcopenia, moves all extremities, ambulatory ?Skin: warm and dry, no rashes or wounds on visible skin ?Neuro:  generalized weakness,  no cognitive impairment ?Psych: non-anxious affect, A and O x 3 ?Hem/lymph/immuno: no widespread bruising ? ? ?Thank you for the opportunity to participate in the care of Ana Merritt.  The palliative care team will continue to follow. Please call our office at [phone] if we can be of additional assistance.  ? ?Ezekiel Slocumb, NP  ? ?COVID-19 PATIENT SCREENING TOOL ?Asked and negative response unless otherwise noted:  ? ?Have you had symptoms of covid, tested positive or been in contact with someone with symptoms/positive test in the past 5-10 days? No ? ?

## 2021-11-15 ENCOUNTER — Other Ambulatory Visit: Payer: Medicare Other | Admitting: Student

## 2021-11-15 DIAGNOSIS — Z515 Encounter for palliative care: Secondary | ICD-10-CM

## 2021-11-15 DIAGNOSIS — E43 Unspecified severe protein-calorie malnutrition: Secondary | ICD-10-CM

## 2021-11-15 DIAGNOSIS — I10 Essential (primary) hypertension: Secondary | ICD-10-CM

## 2021-11-15 DIAGNOSIS — G20 Parkinson's disease: Secondary | ICD-10-CM

## 2021-11-15 NOTE — Progress Notes (Unsigned)
  [facility] Community Palliative Care Consult Note Telephone: [phone]  Fax: [phone]    Date of encounter: 11/15/21 9:47 AM PATIENT NAME: Ana Merritt [address] [place] [address]   [phone] (home)  DOB: 06/06/1931 MRN: [id number] PRIMARY CARE PROVIDER:    [facility],  [address] [place] [address] [phone]  REFERRING PROVIDER:   [facility] [address] [place],  [place] [address] [phone]  RESPONSIBLE PARTY:    Contact Information     Name Relation Home Work Mobile   COBB,JUDY C  [phone]  [phone]        I met face to face with patient and family in the home. Palliative Care was asked to follow this patient by consultation request of  [facility] to address advance care planning and complex medical decision making. This is a follow up visit.                                   ASSESSMENT AND PLAN / RECOMMENDATIONS:   Advance Care Planning/Goals of Care: Goals include to maximize quality of life and symptom management. Patient/health care surrogate gave his/her permission to discuss. Our advance care planning conversation included a discussion about:    The value and importance of advance care planning  Experiences with loved ones who have been seriously ill or have died  Exploration of personal, cultural or spiritual beliefs that might influence medical decisions  Exploration of goals of care in the event of a sudden injury or illness  CODE STATUS: DNR  Education provided on palliative medicine. Will continue to provide supportive care and symptom management.   Symptom Management/Plan:  Parkinson's disease- Continue Rytary 2 tablets every 3 hours. Continue Zoloft 50 mg daily. Monitor for worsening symptoms. Monitor for falls/safety. Use gait belt for ambulation; w/c when going longer distances. Follow up with neurology as scheduled.  Protein calorie  malnutrition- patient is eating better, although she needs to drink more fluids. Encourage foods she enjoys, nutritional supplement as needed. Weight has been stable; monitor for weight loss.   Hypertension-blood pressures have been elevated recently. Continue amlodipine 2.5 mg, losartan had been increased to 100 mg daily. Family to continue checking blood pressures.   Follow up Palliative Care Visit: Palliative care will continue to follow for complex medical decision making, advance care planning, and clarification of goals. Return in 8 weeks or prn.   This visit was coded based on medical decision making (MDM).  PPS: 40%  HOSPICE ELIGIBILITY/DIAGNOSIS: TBD  Chief Complaint: Palliative Medicine follow up visit.   HISTORY OF PRESENT ILLNESS:  Ana Merritt is a 88 y.o. year old female  with Parkinson's disease, memory loss, visual hallucinations, hypertension, hyperlipidemia, hypotension, severe protein calorie malnutrition, osteoporosis.   Patient had a fall on 09/30/21 where she lost her balance and sustained laceration to head. Family has been using gait belt for ambulation. She is eating better; not drinking well per daughter. No recent UTI's. She denies pain, shortness of breath, nausea, constipation. She is sleeping well. A 10-point ROS is negative, except for the pertinent positives and negatives detailed per the HPI.  History obtained from review of EMR, discussion with primary team, and interview with family, facility staff/caregiver and/or Ana Merritt.  I reviewed available labs, medications, imaging, studies and related documents from the EMR.  Records reviewed and summarized above.   Physical   Exam:  Pulse 52, resp 16, b/p 164/88, sats 98% on room air Constitutional: NAD General: frail appearing, thin EYES: anicteric sclera, lids intact, no discharge  ENMT: intact hearing, oral mucous membranes moist, dentition intact CV: S1S2, RRR, no LE edema Pulmonary: LCTA, no increased  work of breathing, no cough, room air Abdomen:  normo-active BS + 4 quadrants, soft and non tender, no ascites GU: deferred MSK: moves all extremities, ambulatory with walker Skin: warm and dry, no rashes or wounds on visible skin, scab to right lateral scalp Neuro: generalized weakness, A & O x 3, forgetful Psych: non-anxious affect, pleasant Hem/lymph/immuno: no widespread bruising   Thank you for the opportunity to participate in the care of Ana Merritt.  The palliative care team will continue to follow. Please call our office at [phone] if we can be of additional assistance.   Ezekiel Slocumb, NP   COVID-19 PATIENT SCREENING TOOL Asked and negative response unless otherwise noted:   Have you had symptoms of covid, tested positive or been in contact with someone with symptoms/positive test in the past 5-10 days? No

## 2022-01-17 ENCOUNTER — Other Ambulatory Visit: Payer: Medicare Other | Admitting: Student

## 2022-01-17 DIAGNOSIS — I10 Essential (primary) hypertension: Secondary | ICD-10-CM

## 2022-01-17 DIAGNOSIS — R296 Repeated falls: Secondary | ICD-10-CM

## 2022-01-17 DIAGNOSIS — Z515 Encounter for palliative care: Secondary | ICD-10-CM

## 2022-01-17 DIAGNOSIS — G20 Parkinson's disease: Secondary | ICD-10-CM

## 2022-01-17 NOTE — Progress Notes (Signed)
[facility] Consult Note Telephone: [phone]  Fax: [phone]    Date of encounter: 01/17/22 9:52 AM PATIENT NAME: Ana Merritt [place] [address]   [phone] (home)  DOB: November 24, 1930 MRN: [id number] PRIMARY CARE PROVIDER:    [place],  [place] [address] [phone]  REFERRING PROVIDER:   [facility] [address] [place],  [place] [address] [phone]  RESPONSIBLE PARTY:    Contact Information     Name Relation Home Work [place] C  [phone]  [phone]        I met face to face with patient and family in the home. Palliative Care was asked to follow this patient by consultation request of  [place] to address advance care planning and complex medical decision making. This is a follow up visit.                                   ASSESSMENT AND PLAN / RECOMMENDATIONS:   Advance Care Planning/Goals of Care: Goals include to maximize quality of life and symptom management. Patient/health care surrogate gave his/her permission to discuss. CODE STATUS: DNR  Symptom Management/Plan:  Parkinson's disease- Continue Rytary 2 tablets every 3 hours. Continue Zoloft 50 mg daily. Patient reports increased dyskinesia. Monitor for worsening symptoms. Monitor for falls/safety; patient with recent falls. Continue to use gait belt for ambulation; w/c when going longer distances. Recommend chair alarm to alert family if she gets up unassisted. Follow up with neurology as scheduled.  Hypertension- continue amlodipine 5 mg daily, losartan 100 mg daily. Family to continue monitoring blood pressures routinely.   Follow up Palliative Care Visit: Palliative care will continue to follow for complex medical decision making, advance care planning, and clarification of goals. Return in 8 weeks or prn.   This visit was coded based on medical  decision making (MDM).  PPS: 40%  HOSPICE ELIGIBILITY/DIAGNOSIS: TBD  Chief Complaint: Palliative Medicine follow up visit.   HISTORY OF PRESENT ILLNESS:  Ana Merritt is a 88 y.o. year old female  with Parkinson's disease, memory loss, visual hallucinations, hypertension, hyperlipidemia, hypotension, severe protein calorie malnutrition, osteoporosis.    Had 3 falls two weeks ago. She did hit her head and break her glasses on one fall and hit her left elbow on another fall.  Amlodipine increased to 5 mg per PCP. Sleeping well. More dyskinesia, legs jumping. Receiving Rytary every 3 hours. Appetite continues to be good. She is having more coughing with eating and drinking.   History obtained from review of EMR, discussion with primary team, and interview with family, facility staff/caregiver and/or Ana Merritt.  I reviewed available labs, medications, imaging, studies and related documents from the EMR.  Records reviewed and summarized above.   ROS  A 10-Point ROS is negative, except pertinent positives and negatives per the HPI.  Physical Exam: Pulse 50, 150/82 (manual), 152/66 (cuff), sats 99% on room air Constitutional: NAD General: frail appearing EYES: anicteric sclera, lids intact, no discharge  ENMT: intact hearing, oral mucous membranes moist, dentition intact CV: S1S2, RRR, mild pedal edema Pulmonary: LCTA, no increased work of breathing, no cough, room air Abdomen: normo-active BS + 4 quadrants, soft and non tender, no ascites GU: deferred MSK: sarcopenia, moves all extremities, ambulatory Skin: warm and dry, no rashes or wounds on visible skin Neuro:  +  generalized weakness, A & O x 3, forgetful Psych: non-anxious affect, pleasant Hem/lymph/immuno: no widespread bruising   Thank you for the opportunity to participate in the care of Ana Merritt.  The palliative care team will continue to follow. Please call our office at [phone] if we can be of additional assistance.    Ezekiel Slocumb, NP   COVID-19 PATIENT SCREENING TOOL Asked and negative response unless otherwise noted:   Have you had symptoms of covid, tested positive or been in contact with someone with symptoms/positive test in the past 5-10 days? No

## 2022-03-07 ENCOUNTER — Other Ambulatory Visit: Payer: Medicare Other | Admitting: Student

## 2022-03-07 DIAGNOSIS — G20A1 Parkinson's disease without dyskinesia, without mention of fluctuations: Secondary | ICD-10-CM

## 2022-03-07 DIAGNOSIS — N390 Urinary tract infection, site not specified: Secondary | ICD-10-CM

## 2022-03-07 DIAGNOSIS — I10 Essential (primary) hypertension: Secondary | ICD-10-CM

## 2022-03-07 DIAGNOSIS — G20 Parkinson's disease: Secondary | ICD-10-CM

## 2022-03-07 DIAGNOSIS — Z515 Encounter for palliative care: Secondary | ICD-10-CM

## 2022-03-07 NOTE — Progress Notes (Signed)
[facility] Palliative Care Consult Note Telephone: [phone]  Fax: [phone]    Date of encounter: 03/07/22 9:36 AM PATIENT NAME: Ana Merritt [place] [id number]   [id number] (home)  DOB: [birth year] MRN: [id number] PRIMARY CARE PROVIDER:    [place],  [name] [address] [phone]  REFERRING PROVIDER:   [place] [address] [place],  [place] [address] [phone]  RESPONSIBLE PARTY:    Contact Information     Name Relation Home Work [place]  [phone]  [phone]        I met face to face with patient and family in the home. Palliative Care was asked to follow this patient by consultation request of  [place] to address advance care planning and complex medical decision making. This is a follow up visit.                                   ASSESSMENT AND PLAN / RECOMMENDATIONS:   Advance Care Planning/Goals of Care: Goals include to maximize quality of life and symptom management. Patient/health care surrogate gave his/her permission to discuss. Our advance care planning conversation included a discussion about:    The value and importance of advance care planning  Experiences with loved ones who have been seriously ill or have died  Exploration of personal, cultural or spiritual beliefs that might influence medical decisions  Exploration of goals of care in the event of a sudden injury or illness  CODE STATUS: DNR  Education provided on Palliative Medicine vs. Hospice services. Plan is to manage her in the home. We discussed aggressive treatment path vs. Comfort path. Son would like to treat the treatable.   Symptom Management/Plan:  Parkinson's disease-continue Rytary as directed. Monitor for worsening symptoms. Continue to use gait belt for ambulation, w/c when going longer distances. Monitor for falls/safety. Patient  encouraged not to get up unassisted. Monitor for aspiration, dysphagia.   Hypertension-patient with labile blood pressures. Recent ED visits for hypertensive emergency. Continue amlodipine 10 mg daily, losartan 100 mg daily. Family to check blood pressures routinely and to notify PCP. Patient generally drinks poorly; encourage adequate fluids.   Urinary tract infection-patient treated for UTI x 2 in the past month. Encourage adequate fluids. Continue antibiotics as ordered.   Follow up Palliative Care Visit: Palliative care will continue to follow for complex medical decision making, advance care planning, and clarification of goals. Return in 10-12 weeks or prn.   This visit was coded based on medical decision making (MDM).  PPS: 40%  HOSPICE ELIGIBILITY/DIAGNOSIS: TBD  Chief Complaint: Palliative Medicine follow up visit.   HISTORY OF PRESENT ILLNESS:  Ana Merritt is a 88 y.o. year old female  with Parkinson's disease, memory loss, visual hallucinations, hypertension, hyperlipidemia, hypotension, severe protein calorie malnutrition, osteoporosis.  patient with hospital admission 8/8-8/10/23 due to [facility], s/p fall, ED visits on 8/8 d/t hypertensive emergency and ED visit on 03/06/22 d/t UTI.    Patient returned from ED around 330am this morning. Daughter is to pick up antibiotics for UTI. She's had some congestion x 1 week, hoarse. Denies sore throat, no fever or chills. Denies pain, shortness of breath. Endorses constipation. Denies feeling dizzy or lightheaded. Family is checking blood pressures routinely. Her amlodipine has been increased to 10 mg QHS. She had Zio patch after  most recent hospitalization. Family has decided against pacemaker. Cardiology visit scheduled for November.   History obtained from review of EMR, discussion with primary team, and interview with family, facility staff/caregiver and/or Ana Merritt.  I reviewed available labs, medications, imaging, studies and related  documents from the EMR.  Records reviewed and summarized above.   ROS  A 10-Point ROS is negative, except pertinent positives and negatives per the HPI.   Physical Exam: Pulse  56, b/p 104/56, sats 94% on room air Constitutional: NAD General: frail appearing, thin EYES: anicteric sclera, lids intact, no discharge  ENMT: intact hearing, oral mucous membranes dry CV: S1S2, RRR, no LE edema Pulmonary: LCTA except right base slightly diminished, no increased work of breathing, no cough, room air Abdomen: normo-active BS + 4 quadrants, soft and non tender GU: deferred MSK: +sarcopenia, moves all extremities, ambulatory Skin: warm and dry, no rashes or wounds on visible skin Neuro: + generalized weakness, A & O x 2, forgetful Psych: non-anxious affect Hem/lymph/immuno: no widespread bruising   Thank you for the opportunity to participate in the care of Ana Merritt.  The palliative care team will continue to follow. Please call our office at [phone] if we can be of additional assistance.   Ezekiel Slocumb, NP   COVID-19 PATIENT SCREENING TOOL Asked and negative response unless otherwise noted:   Have you had symptoms of covid, tested positive or been in contact with someone with symptoms/positive test in the past 5-10 days? No

## 2022-05-30 ENCOUNTER — Other Ambulatory Visit: Payer: Medicare Other | Admitting: Student

## 2022-09-24 DEATH — deceased

## 2023-05-29 IMAGING — CT CT CERVICAL SPINE W/O CM
3 of 4 series · 12 of 33 positions shown, 14 images · non-contrast
Comparison: None.

CLINICAL DATA: Fall



[Series 6: sagittal bone · sagittal · 0.28mm/px · 5 of 57 slices shown, 6 images]
[im 19/57  bone]
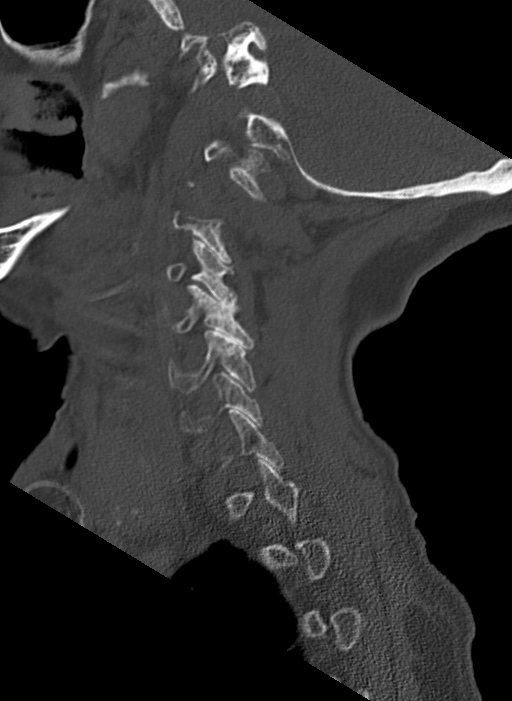
[im 24/57  bone]
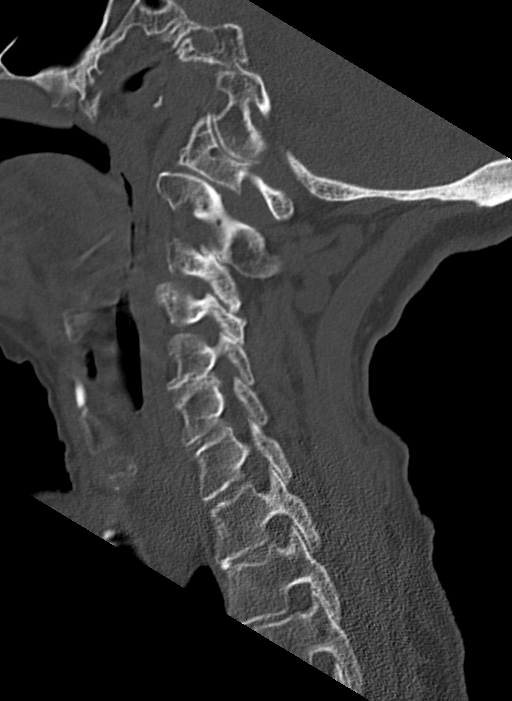
[im 29/57  soft-tissue]
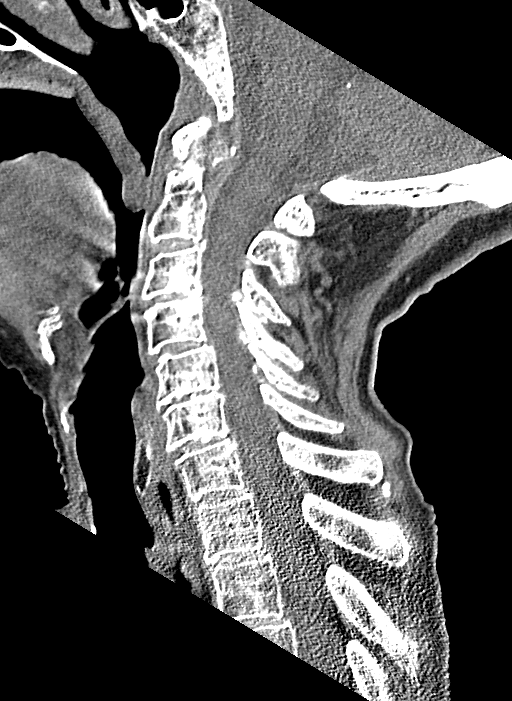
[im 29/57  bone]
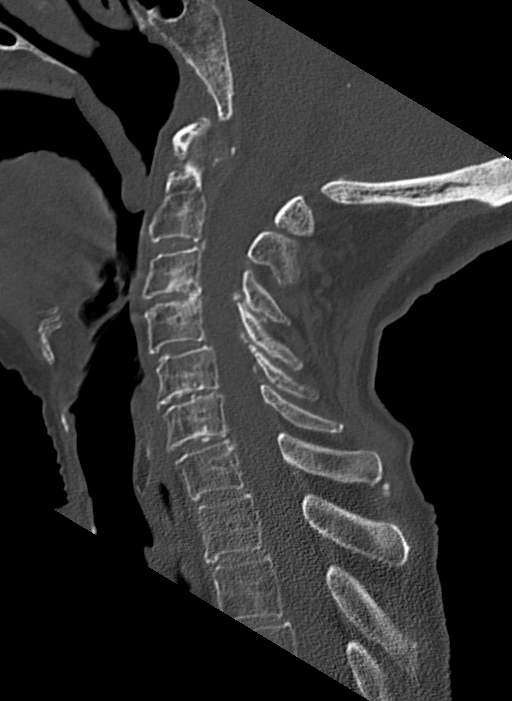
[im 33/57  bone]
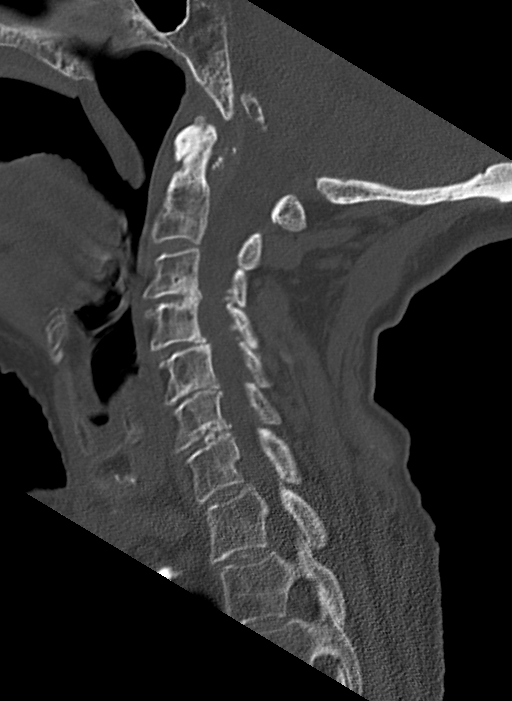
[im 38/57  bone]
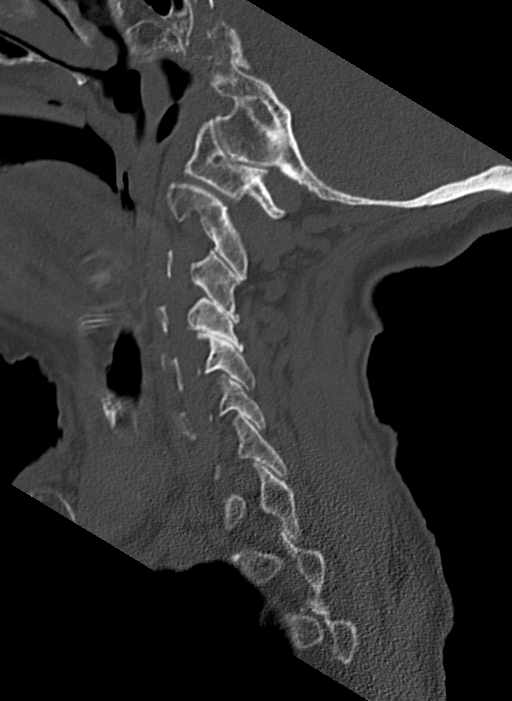

[Series 7: coronal bone · coronal · 0.22mm/px · 3 of 71 slices shown]
[im 21/71  bone]
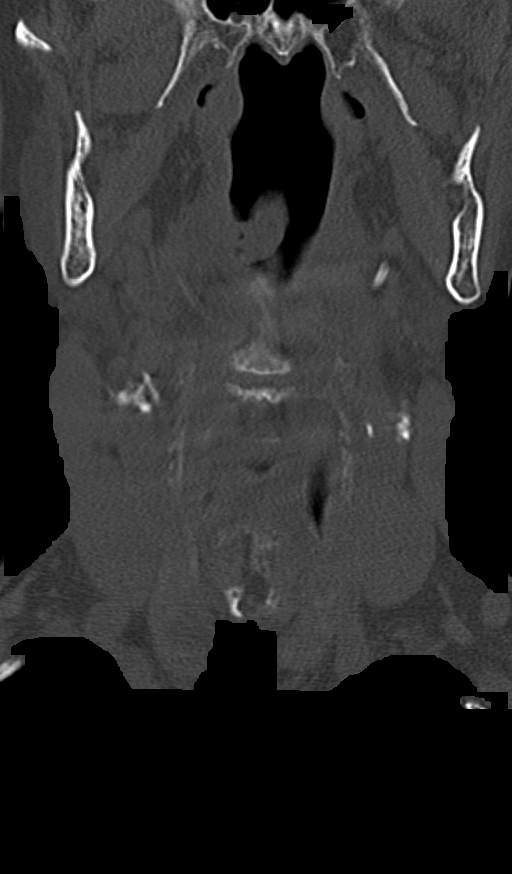
[im 31/71  bone]
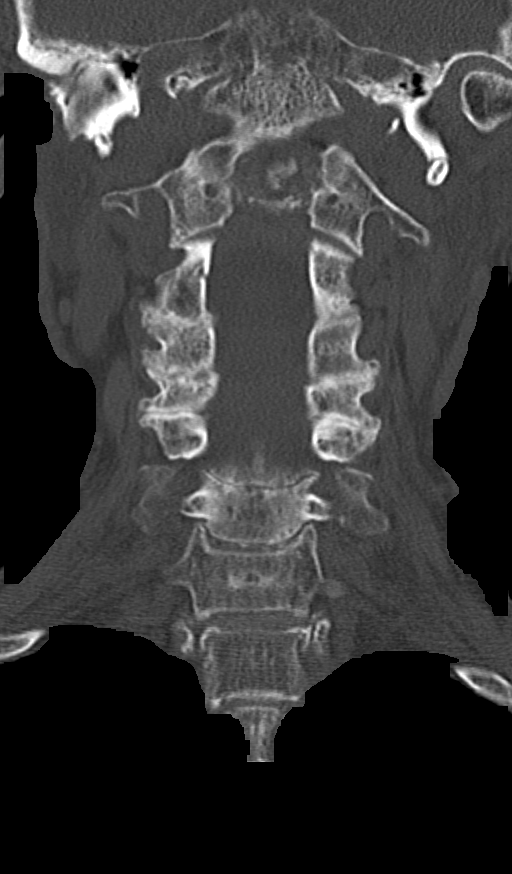
[im 40/71  bone]
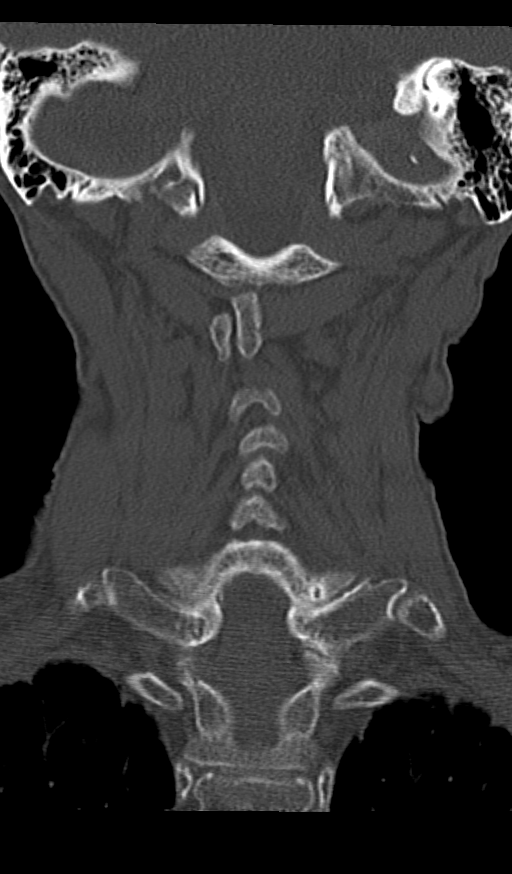

[Series 8: orthogonal bone · axial · 0.22mm/px · z∈[-48,+76]mm · 4 of 97 slices shown, 5 images]
[im 13/97  soft-tissue]
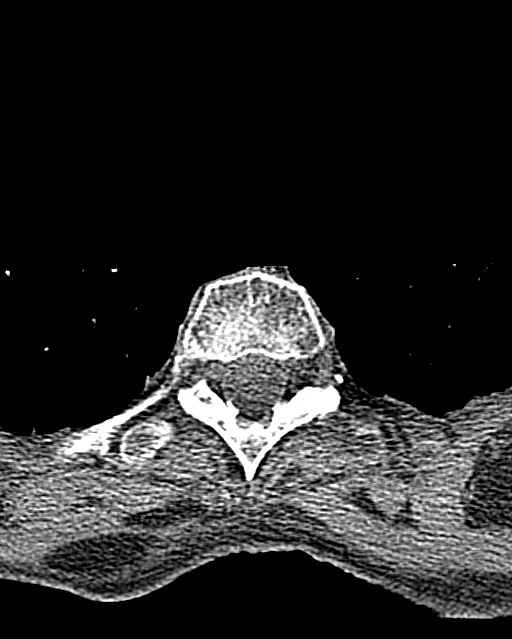
[im 13/97  bone]
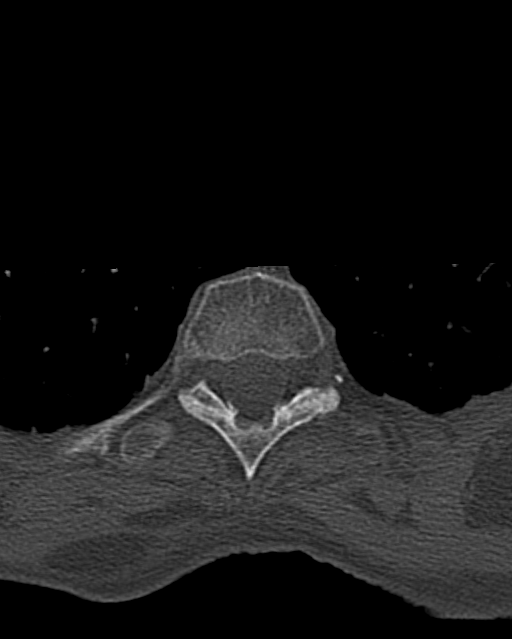
[im 37/97  bone]
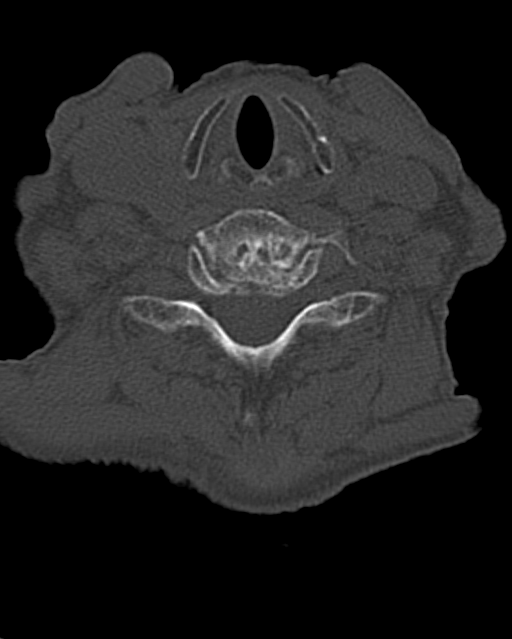
[im 61/97  bone]
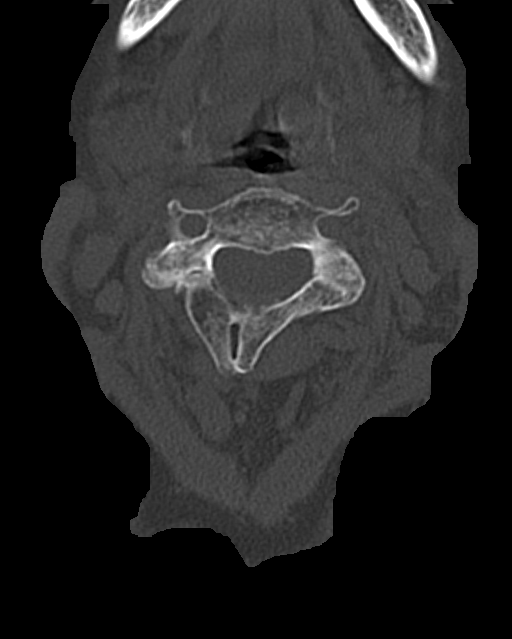
[im 85/97  bone]
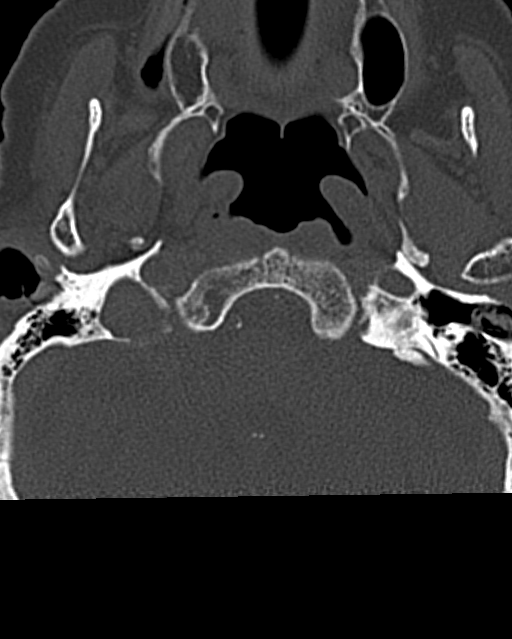

[12 of 33 positions shown; findings below may reference images not displayed]

FINDINGS: Alignment: Normal cervical lordosis.

Skull base and vertebrae: No acute fracture. No primary bone lesion
or focal pathologic process.

Soft tissues and spinal canal: No prevertebral fluid or swelling. No
visible canal hematoma.

Disc levels: Mild degenerative changes of the mid/lower cervical
spine.

Upper chest: Visualized lung apices are clear.

Other: Right thyroid nodules measuring up to 2.2 cm, chronic. In the
setting of significant comorbidities or limited life expectancy, no
follow-up recommended (ref: [HOSPITAL]. [DATE]):
IMPRESSION: No evidence of traumatic injury to the cervical spine.

Mild degenerative changes.
